# Patient Record
Sex: Male | Born: 1970 | Race: White | Hispanic: No | Marital: Married
Health system: Southern US, Community
[De-identification: ages and names within clinical notes are randomized; demographics above are authoritative.]

---

## 2019-09-11 ENCOUNTER — Other Ambulatory Visit: Payer: Self-pay | Admitting: Internal Medicine

## 2019-09-18 ENCOUNTER — Other Ambulatory Visit: Payer: Self-pay | Admitting: Internal Medicine

## 2020-05-02 ENCOUNTER — Ambulatory Visit
Admission: RE | Admit: 2020-05-02 | Discharge: 2020-05-02 | Disposition: A | Discharge status: Home / Self Care | Payer: 59 | Source: Ambulatory Visit | Attending: Internal Medicine | Admitting: Internal Medicine

## 2020-05-02 ENCOUNTER — Other Ambulatory Visit: Payer: Self-pay | Admitting: Internal Medicine

## 2020-05-02 DIAGNOSIS — R079 Chest pain, unspecified: Secondary | ICD-10-CM

## 2021-06-09 ENCOUNTER — Encounter (HOSPITAL_COMMUNITY): Payer: Self-pay | Admitting: *Deleted

## 2021-06-09 ENCOUNTER — Emergency Department (HOSPITAL_COMMUNITY)
Admission: EM | Admit: 2021-06-09 | Discharge: 2021-06-09 | Disposition: A | Discharge status: Home / Self Care | Payer: 59 | Attending: Emergency Medicine | Admitting: Emergency Medicine

## 2021-06-09 ENCOUNTER — Emergency Department (HOSPITAL_COMMUNITY): Payer: 59

## 2021-06-09 DIAGNOSIS — N200 Calculus of kidney: Secondary | ICD-10-CM | POA: Diagnosis not present

## 2021-06-09 DIAGNOSIS — R109 Unspecified abdominal pain: Secondary | ICD-10-CM | POA: Diagnosis present

## 2021-06-09 LAB — URINALYSIS, ROUTINE W REFLEX MICROSCOPIC
Bilirubin Urine: NEGATIVE
Glucose, UA: 100 mg/dL — AB
Leukocytes,Ua: NEGATIVE
Nitrite: NEGATIVE
Protein, ur: 30 mg/dL — AB
RBC / HPF: >50 RBC/hpf — ABNORMAL HIGH (ref 0–5)
Specific Gravity, Urine: 1.02 (ref 1.005–1.030)
pH: 6 (ref 5.0–8.0)

## 2021-06-09 LAB — COMPREHENSIVE METABOLIC PANEL
ALT: 48 U/L — ABNORMAL HIGH (ref 0–44)
AST: 25 U/L (ref 15–41)
Albumin: 4.4 g/dL (ref 3.5–5.0)
Alkaline Phosphatase: 56 U/L (ref 38–126)
Anion gap: 11 (ref 5–15)
BUN: 17 mg/dL (ref 6–20)
CO2: 27 mmol/L (ref 22–32)
Calcium: 9.3 mg/dL (ref 8.9–10.3)
Chloride: 102 mmol/L (ref 98–111)
Creatinine, Ser: 1.1 mg/dL (ref 0.61–1.24)
GFR, Estimated: >60 mL/min (ref >60)
Glucose, Bld: 126 mg/dL — ABNORMAL HIGH (ref 70–99)
Potassium: 3.4 mmol/L — ABNORMAL LOW (ref 3.5–5.1)
Sodium: 140 mmol/L (ref 135–145)
Total Bilirubin: 0.8 mg/dL (ref 0.3–1.2)
Total Protein: 7.8 g/dL (ref 6.5–8.1)

## 2021-06-09 LAB — CBC WITH DIFFERENTIAL/PLATELET
Abs Immature Granulocytes: 0.02 10*3/uL (ref 0.00–0.07)
Basophils Absolute: 0 10*3/uL (ref 0.0–0.1)
Basophils Relative: 0 %
Eosinophils Absolute: 0.1 10*3/uL (ref 0.0–0.5)
Eosinophils Relative: 1 %
HCT: 47.7 % (ref 39.0–52.0)
Hemoglobin: 16 g/dL (ref 13.0–17.0)
Immature Granulocytes: 0 %
Lymphocytes Relative: 31 %
Lymphs Abs: 2.3 10*3/uL (ref 0.7–4.0)
MCH: 31.1 pg (ref 26.0–34.0)
MCHC: 33.5 g/dL (ref 30.0–36.0)
MCV: 92.8 fL (ref 80.0–100.0)
Monocytes Absolute: 0.8 10*3/uL (ref 0.1–1.0)
Monocytes Relative: 10 %
Neutro Abs: 4.2 10*3/uL (ref 1.7–7.7)
Neutrophils Relative %: 58 %
Platelets: 250 10*3/uL (ref 150–400)
RBC: 5.14 MIL/uL (ref 4.22–5.81)
RDW: 13.4 % (ref 11.5–15.5)
WBC: 7.4 10*3/uL (ref 4.0–10.5)
nRBC: 0 % (ref 0.0–0.2)

## 2021-06-09 NOTE — ED Provider Notes (Signed)
[facility]-EMERGENCY DEPT Provider Note   CSN: [id number] Arrival date & time: 06/09/21  1330     History Chief Complaint  Patient presents with   Flank Pain    Jorge Gilbert is a 50 y.o. male presenting for evaluation of flank pain.  Patient states as prior to arrival he developed sudden onset severe right-sided flank pain that radiated to his right lower quadrant abdomen.  This lasted for about 30 minutes before pain resolved, he has pain.  Associated nausea, but no vomiting.  No fevers or chills.  No cough or chest pain.  No urinary symptoms or abnormal bowel movements.  No history of similar.  No history of kidney stones.  He did not take anything for it.  He has no medical problems, takes medications daily  HPI     History reviewed. No pertinent past medical history.  There are no problems to display for this patient.   History reviewed. No pertinent surgical history.     No family history on file.     Home Medications Prior to Admission medications   Not on File    Allergies    Patient has no allergy information on record.  Review of Systems   Review of Systems  Gastrointestinal:  Positive for abdominal pain and nausea.  Genitourinary:  Positive for flank pain.  All other systems reviewed and are negative.  Physical Exam Updated Vital Signs BP (!) 152/114   Pulse 87   Temp 98.1 F (36.7 C) (Oral)   Resp 18   SpO2 100%   Physical Exam Vitals and nursing note reviewed.  Constitutional:      General: He is not in acute distress.    Appearance: Normal appearance.     Comments: Resting in the bed in no acute distress  HENT:     Head: Normocephalic and atraumatic.  Eyes:     Conjunctiva/sclera: Conjunctivae normal.     Pupils: Pupils are equal, round, and reactive to light.  Cardiovascular:     Rate and Rhythm: Normal rate and regular rhythm.     Pulses: Normal pulses.  Pulmonary:     Effort: Pulmonary effort is normal. No  respiratory distress.     Breath sounds: Normal breath sounds. No wheezing.     Comments: Speaking in full sentences.  Clear lung sounds in all fields. Abdominal:     General: There is no distension.     Palpations: Abdomen is soft. There is no mass.     Tenderness: There is no abdominal tenderness. There is no right CVA tenderness, left CVA tenderness, guarding or rebound.     Comments: No TTP of the abdomen.  No CVA tenderness.  Musculoskeletal:        General: Normal range of motion.     Cervical back: Normal range of motion and neck supple.  Skin:    General: Skin is warm and dry.     Capillary Refill: Capillary refill takes less than 2 seconds.  Neurological:     Mental Status: He is alert and oriented to person, place, and time.  Psychiatric:        Mood and Affect: Mood and affect normal.        Speech: Speech normal.        Behavior: Behavior normal.    ED Results / Procedures / Treatments   Labs (all labs ordered are listed, but only abnormal results are displayed) Labs Reviewed  URINALYSIS, ROUTINE W REFLEX MICROSCOPIC -  Abnormal; Notable for the following components:      Result Value   Color, Urine AMBER (*)    APPearance CLOUDY (*)    Glucose, UA 100 (*)    Hgb urine dipstick LARGE (*)    Ketones, ur TRACE (*)    Protein, ur 30 (*)    RBC / HPF >50 (*)    Bacteria, UA RARE (*)    All other components within normal limits  COMPREHENSIVE METABOLIC PANEL - Abnormal; Notable for the following components:   Potassium 3.4 (*)    Glucose, Bld 126 (*)    ALT 48 (*)    All other components within normal limits  CBC WITH DIFFERENTIAL/PLATELET    EKG None  Radiology CT Renal Stone Study  Result Date: 06/09/2021 CLINICAL DATA:  right flank pain since this morning EXAM: CT ABDOMEN AND PELVIS WITHOUT CONTRAST TECHNIQUE: Multidetector CT imaging of the abdomen and pelvis was performed following the standard protocol without IV contrast. COMPARISON:  None. FINDINGS:  Lower chest: Clear lung bases. Normal heart size without pericardial or pleural effusion. Hepatobiliary: Moderate to marked hepatic steatosis. Normal gallbladder, without biliary ductal dilatation. Pancreas: Normal, without mass or ductal dilatation. Spleen: Normal in size, without focal abnormality. Adrenals/Urinary Tract: Normal adrenal glands. No renal calculi or hydronephrosis. The right ureter is mildly prominent. A punctate stone at the right bladder base on 81/2 is favored to be just distal to the ureteropelvic junction. See also coronal image 98. Stomach/Bowel: Normal stomach, without wall thickening. Normal colon, appendix, and terminal ileum. Normal small bowel. Vascular/Lymphatic: Normal caliber of the aorta and branch vessels. Retroaortic left renal vein. No abdominopelvic adenopathy. Reproductive: Normal prostate. Other: No significant free fluid. A tiny fat containing right inguinal hernia. Musculoskeletal: Degenerative disc disease at the lumbosacral junction. IMPRESSION: 1. Punctate stone at the right bladder base is favored to be just distal to the ureteropelvic junction. Mild right hydroureter, without significant hydronephrosis. 2. Hepatic steatosis. Electronically Signed   By: Jeronimo Greaves M.D.   On: 06/09/2021 15:48    Procedures Procedures   Medications Ordered in ED Medications - No data to display  ED Course  I have reviewed the triage vital signs and the nursing notes.  Pertinent labs & imaging results that were available during my care of the patient were reviewed by me and considered in my medical decision making (see chart for details).    MDM Rules/Calculators/A&P                           Patient presenting for evaluation of right-sided flank and abdominal pain.  On exam, patient peers nontoxic.  Pain has resolved.  In the setting of acute onset pain which resolved suddenly, consider kidney stone.  However also consider UTI or [place].  Consider intestinal issue.  Labs  obtained in triage interpreted by me, overall reassuring.  Urine with blood, but no signs of infection.  CT consistent with recently passed kidney stone.  Discussed findings with patient.  Discussed importance of hydration and watching for the kidney stone to pass.  Encourage follow-up with urology as needed.  Discussed dietary changes to prevent future kidney stones.  At this time, patient appears safe for discharge.  Return precautions given.  Patient states he understands and agrees to plan.   Final Clinical Impression(s) / ED Diagnoses Final diagnoses:  Kidney stone    Rx / DC Orders ED Discharge Orders     None  Alveria Apley, PA-C 06/09/21 1625    Cathren Laine, MD 06/10/21 [phone]

## 2021-06-09 NOTE — ED Triage Notes (Signed)
Pt complains of right abd pain radiating to right flank pain that occurred today. No hematuria, vomiting, or diarrhea.

## 2021-06-09 NOTE — ED Provider Notes (Signed)
Emergency Medicine Provider Triage Evaluation Note  Jorge Gilbert , a 50 y.o. male  was evaluated in triage.  Pt complains of r sided flank pain. Sudden onset. Sxs have resolved since. No urinary sxs. No h/o kidney stones.   Review of Systems  Positive: Flank pain Negative: fever  Physical Exam  BP (!) 147/89 (BP Location: Left Arm)   Pulse 82   Temp 98.1 F (36.7 C) (Oral)   Resp 18   SpO2 94%  Gen:   Awake, no distress   Resp:  Normal effort  MSK:   Moves extremities without difficulty  Other:  No ttp of abd, cva, or flank  Medical Decision Making  Medically screening exam initiated at 2:24 PM.  Appropriate orders placed.  Jorge Gilbert was informed that the remainder of the evaluation will be completed by another provider, this initial triage assessment does not replace that evaluation, and the importance of remaining in the ED until their evaluation is complete.  Labs, ct, Amie Portland, PA-C 06/09/21 1430    Cathren Laine, MD 06/09/21 1616

## 2021-06-09 NOTE — Discharge Instructions (Signed)
Your work-up today showed that you had a kidney stone.  The good news is this has already passed into your bladder, and you should urinated out in the next few days. Make sure you stay well-hydrated with water. Take Tylenol or ibuprofen as needed for pain. There is information about dietary changes that may prevent kidney stones in the future. You may also follow-up with urologist listed below as needed for further evaluation. Return to the emergency room develop high fevers, severe worsening pain, inability urinate, or any new, worsening, or concerning symptoms

## 2021-09-04 ENCOUNTER — Other Ambulatory Visit (HOSPITAL_COMMUNITY): Payer: Self-pay | Admitting: Internal Medicine

## 2021-09-11 ENCOUNTER — Ambulatory Visit (HOSPITAL_COMMUNITY): Admission: RE | Admit: 2021-09-11 | Payer: 59 | Source: Ambulatory Visit

## 2021-09-11 ENCOUNTER — Encounter (HOSPITAL_COMMUNITY): Payer: Self-pay

## 2021-11-14 ENCOUNTER — Other Ambulatory Visit: Payer: Self-pay

## 2021-11-14 ENCOUNTER — Ambulatory Visit (HOSPITAL_COMMUNITY)
Admission: RE | Admit: 2021-11-14 | Discharge: 2021-11-14 | Disposition: A | Discharge status: Home / Self Care | Payer: Self-pay | Source: Ambulatory Visit | Attending: Internal Medicine | Admitting: Internal Medicine

## 2021-11-14 DIAGNOSIS — E782 Mixed hyperlipidemia: Secondary | ICD-10-CM | POA: Insufficient documentation

## 2021-11-14 DIAGNOSIS — Z136 Encounter for screening for cardiovascular disorders: Secondary | ICD-10-CM | POA: Insufficient documentation

## 2021-11-14 DIAGNOSIS — K760 Fatty (change of) liver, not elsewhere classified: Secondary | ICD-10-CM | POA: Insufficient documentation

## 2022-07-10 ENCOUNTER — Other Ambulatory Visit: Payer: Self-pay | Admitting: Internal Medicine

## 2022-07-10 DIAGNOSIS — M5412 Radiculopathy, cervical region: Secondary | ICD-10-CM

## 2022-07-18 ENCOUNTER — Ambulatory Visit (HOSPITAL_BASED_OUTPATIENT_CLINIC_OR_DEPARTMENT_OTHER): Payer: 59

## 2022-09-22 IMAGING — CT CT CARDIAC CORONARY ARTERY CALCIUM SCORE
2 series · 15 of 20 positions shown, 17 images · non-contrast
Comparison: None.
COMPARISON: None.

Addendum:
EXAM:
OVER-READ INTERPRETATION  CT CHEST

The following report is an over-read performed by radiologist Dr.
over-read does not include interpretation of cardiac or coronary
anatomy or pathology. The coronary calcium score interpretation by
the cardiologist is attached.
CLINICAL DATA: 51M for cardiovascular disease risk stratification
Coronary Calcium Score
TECHNIQUE: A gated, non-contrast computed tomography scan of the heart was
performed using 3mm slice thickness. Axial images were analyzed on a
dedicated workstation. Calcium scoring of the coronary arteries was
performed using the Agatston method.

[Series 3: cascseq 2.0 b35f 70% · axial · 0.39mm/px · z∈[+1276,+1366]mm · 7 of 69 slices shown]
[im 8/69  vessel]
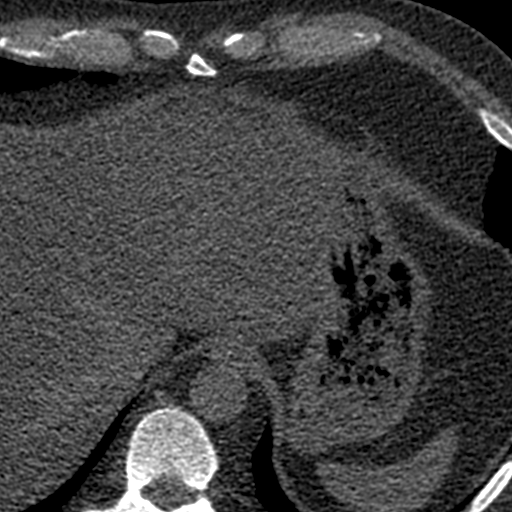
[im 16/69  vessel]
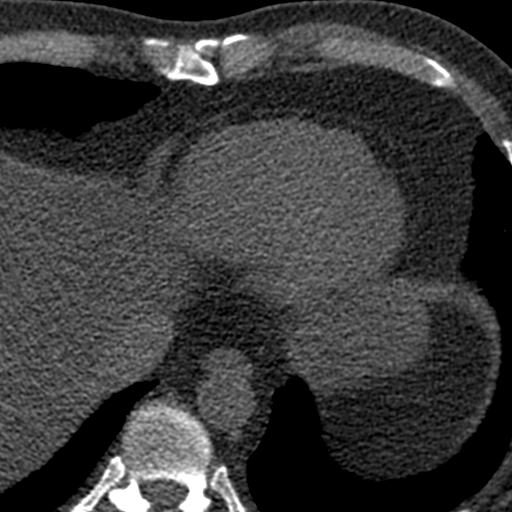
[im 23/69  vessel]
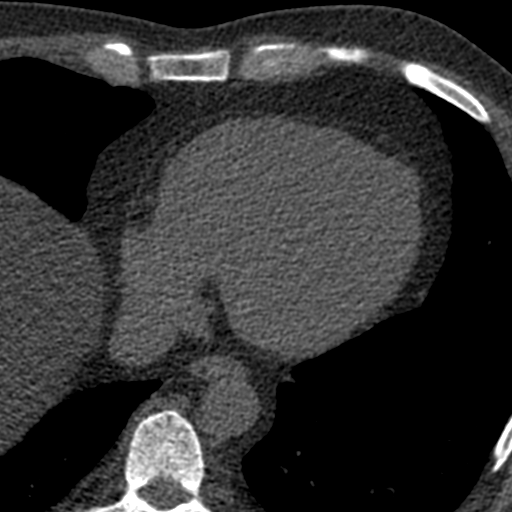
[im 31/69  vessel]
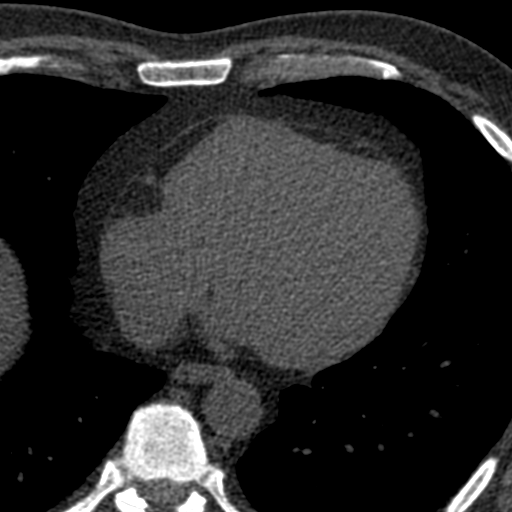
[im 38/69  vessel]
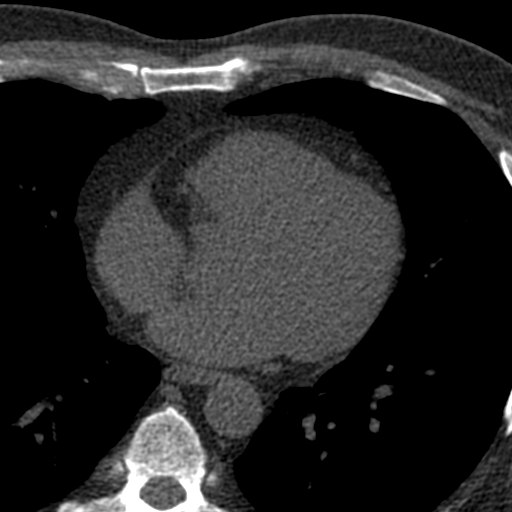
[im 46/69  vessel]
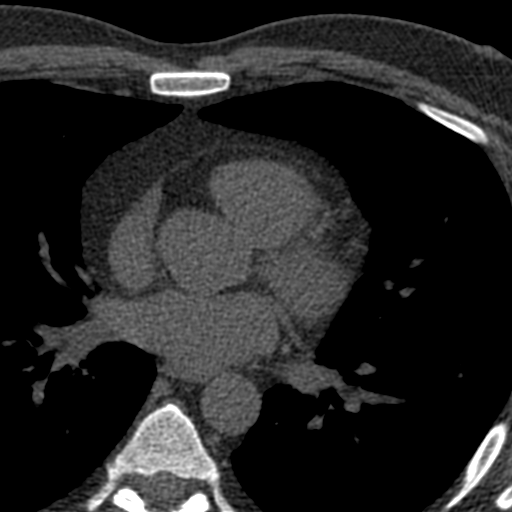
[im 53/69  vessel]
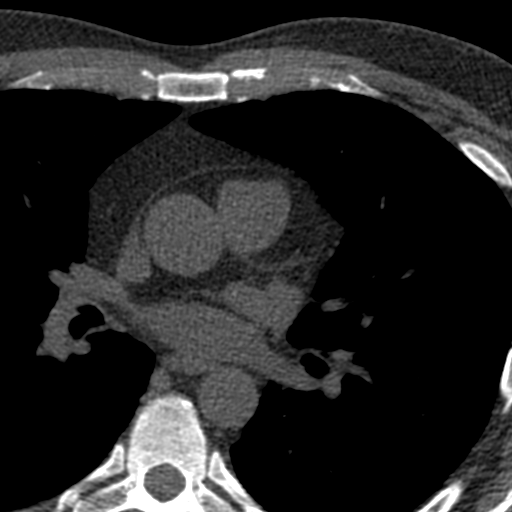

[Series 4: ax st full fov · axial · 0.66mm/px · z∈[+1276,+1382]mm · 8 of 69 slices shown, 10 images]
[im 8/69  vessel]
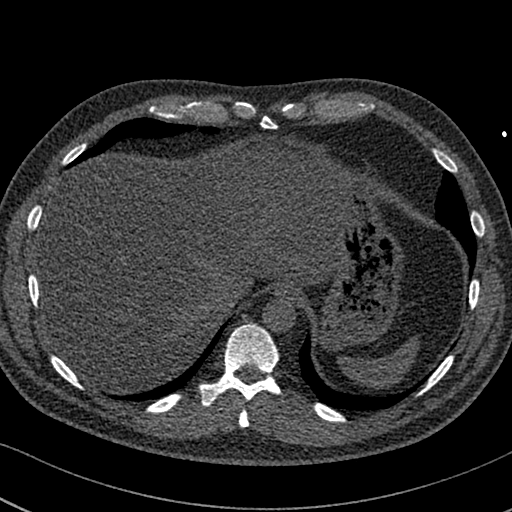
[im 8/69  lung]
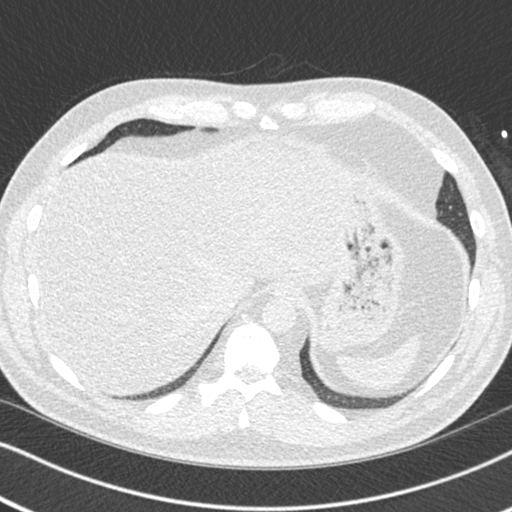
[im 16/69  vessel]
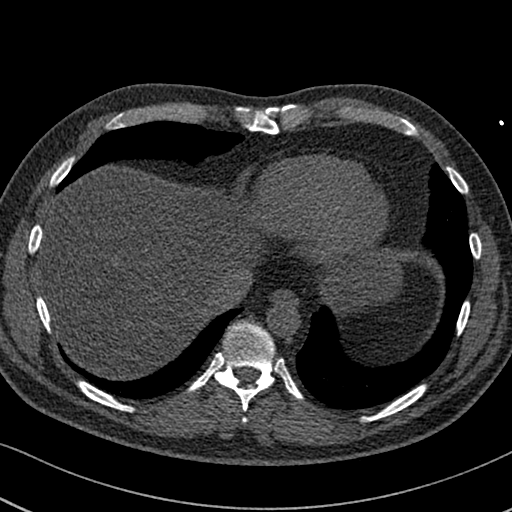
[im 23/69  vessel]
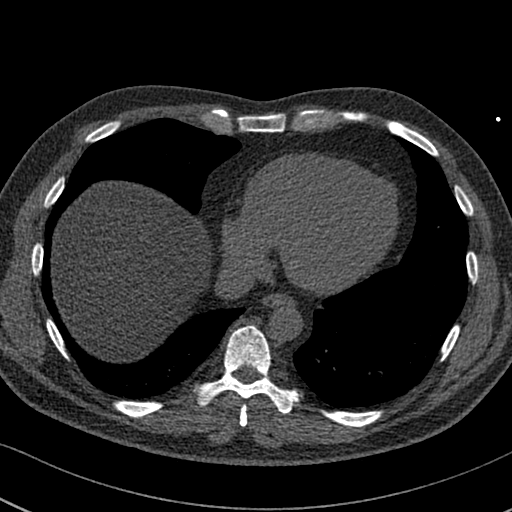
[im 31/69  vessel]
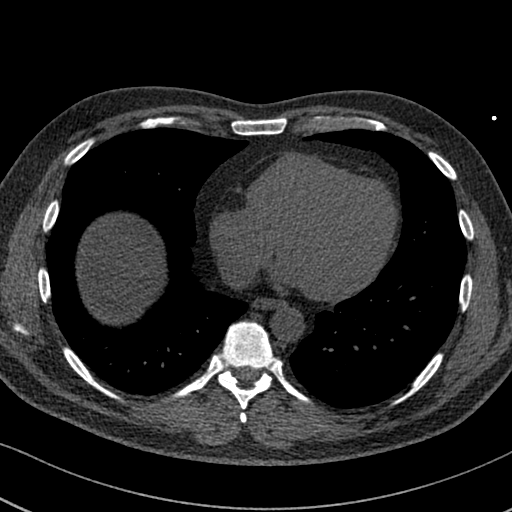
[im 38/69  vessel]
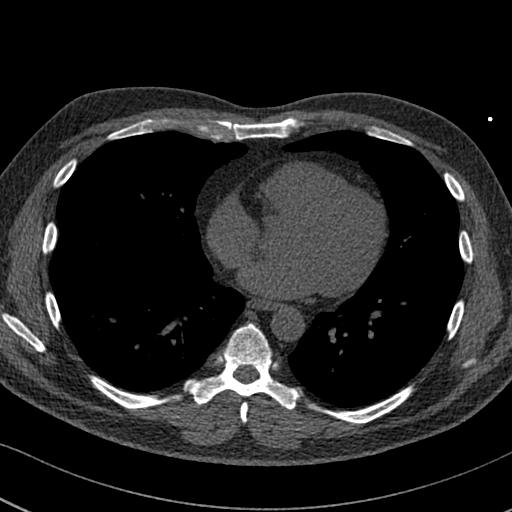
[im 38/69  lung]
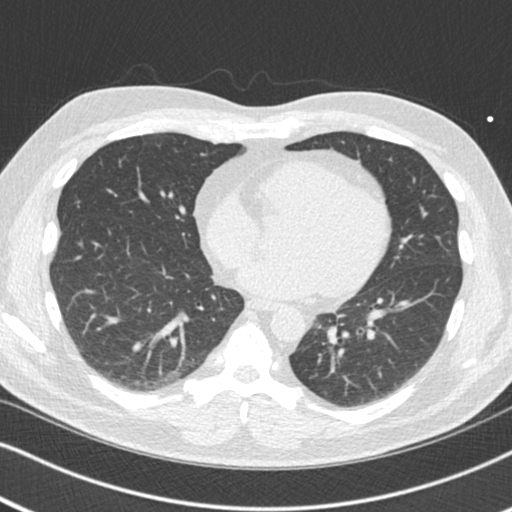
[im 46/69  vessel]
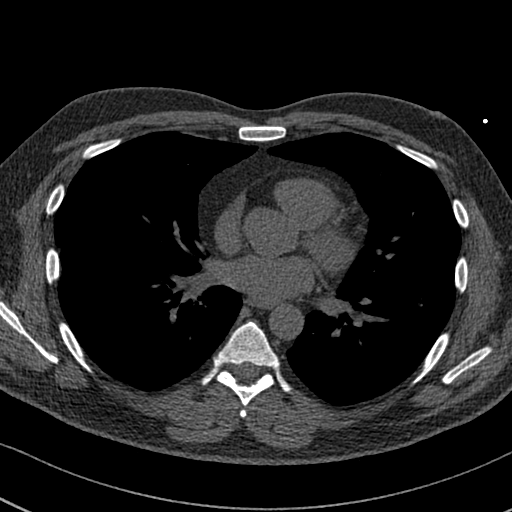
[im 53/69  vessel]
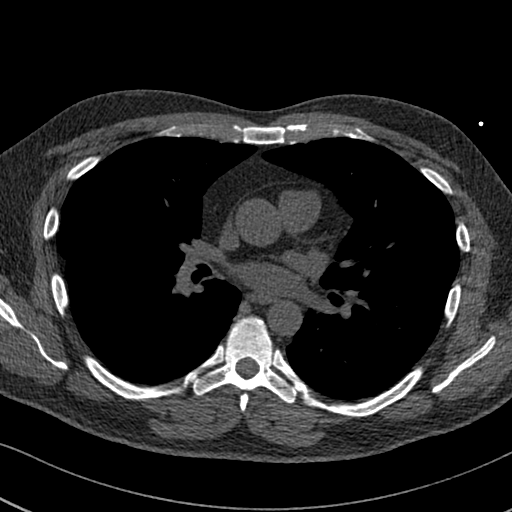
[im 61/69  vessel]
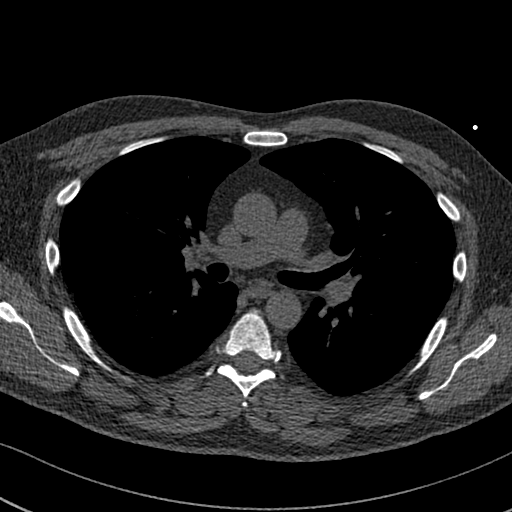

[15 of 20 positions shown; findings below may reference images not displayed]

FINDINGS: Vascular: No significant incidental noncardiac vascular findings.

Mediastinum/Nodes: Visualized mediastinum and hilar regions
demonstrate no lymphadenopathy or masses.

Lungs/Pleura: Visualized lungs show no evidence of pulmonary edema,
consolidation, pneumothorax, nodule or pleural fluid.

Upper Abdomen: The visualized liver demonstrates evidence of severe
steatosis.

Musculoskeletal: No chest wall mass or suspicious bone lesions
identified.
IMPRESSION: Severe hepatic steatosis.
FINDINGS: Coronary arteries: Normal origins.

Coronary Calcium Score: 0

Percentile: 0

Pericardium: Normal.

Ascending Aorta: Normal caliber.

Non-cardiac: See separate report from [REDACTED].
IMPRESSION: Coronary calcium score of 0. This was 0 percentile for age-, race-,
and sex-matched controls.



If CAC=0, it is reasonable to withhold statin therapy and reassess
in 5 to 10 years, as long as higher risk conditions are absent
(diabetes mellitus, family history of premature CHD in first degree
relatives (males <55 years; females <65 years), cigarette smoking,
or LDL >=190 mg/dL).

If CAC is 1 to 99, it is reasonable to initiate statin therapy for
patients >=55 years of age.

If CAC is >=100 or >=75th percentile, it is reasonable to initiate
statin therapy at any age.

Cardiology referral should be considered for patients with CAC
scores >=400 or >=75th percentile.

*8959 AHA/ACC/AACVPR/AAPA/ABC/SANG/JAIMIN/IRASEMA/Kaki/AMNON/OLAWALE ALANI/BHEBHE
Guideline on the Management of Blood Cholesterol: A Report of the
American College of Cardiology/American Heart Association Task Force
on Clinical Practice Guidelines. J Am Coll Cardiol.
1396;73(24):0381-0550.

*** End of Addendum ***
EXAM:
OVER-READ INTERPRETATION  CT CHEST

The following report is an over-read performed by radiologist Dr.
over-read does not include interpretation of cardiac or coronary
anatomy or pathology. The coronary calcium score interpretation by
the cardiologist is attached.
FINDINGS: Vascular: No significant incidental noncardiac vascular findings.

Mediastinum/Nodes: Visualized mediastinum and hilar regions
demonstrate no lymphadenopathy or masses.

Lungs/Pleura: Visualized lungs show no evidence of pulmonary edema,
consolidation, pneumothorax, nodule or pleural fluid.

Upper Abdomen: The visualized liver demonstrates evidence of severe
steatosis.

Musculoskeletal: No chest wall mass or suspicious bone lesions
identified.
IMPRESSION: Severe hepatic steatosis.
